# Patient Record
Sex: Male | Born: 2005
Health system: Southern US, Community
[De-identification: ages and names within clinical notes are randomized; demographics above are authoritative.]

## PROBLEM LIST (undated history)

## (undated) DIAGNOSIS — K5900 Constipation, unspecified: Secondary | ICD-10-CM

## (undated) HISTORY — PX: OTHER SURGICAL HISTORY: SHX169

---
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
## 2023-01-05 DIAGNOSIS — H47331 Pseudopapilledema of optic disc, right eye: Secondary | ICD-10-CM | POA: Diagnosis not present

## 2023-01-07 ENCOUNTER — Other Ambulatory Visit: Payer: Self-pay

## 2023-01-07 ENCOUNTER — Encounter: Payer: Self-pay | Admitting: Optometry

## 2023-01-07 ENCOUNTER — Other Ambulatory Visit: Payer: Self-pay | Admitting: Optometry

## 2023-01-07 DIAGNOSIS — H47331 Pseudopapilledema of optic disc, right eye: Secondary | ICD-10-CM

## 2023-01-07 DIAGNOSIS — H4720 Unspecified optic atrophy: Secondary | ICD-10-CM

## 2023-02-01 ENCOUNTER — Ambulatory Visit
Admission: RE | Admit: 2023-02-01 | Discharge: 2023-02-01 | Disposition: A | Discharge status: Home / Self Care | Payer: BC Managed Care – PPO | Source: Ambulatory Visit | Attending: Optometry | Admitting: Optometry

## 2023-02-01 DIAGNOSIS — H47331 Pseudopapilledema of optic disc, right eye: Secondary | ICD-10-CM | POA: Diagnosis not present

## 2023-02-01 MED ORDER — GADOPICLENOL 0.5 MMOL/ML IV SOLN
9.0000 mL | Freq: Once | INTRAVENOUS | Status: AC | PRN
  Administered 2023-02-01: 9 mL via INTRAVENOUS

## 2023-02-10 ENCOUNTER — Other Ambulatory Visit: Payer: BLUE CROSS/BLUE SHIELD

## 2023-07-06 DIAGNOSIS — Z131 Encounter for screening for diabetes mellitus: Secondary | ICD-10-CM | POA: Diagnosis not present

## 2023-07-06 DIAGNOSIS — Z0000 Encounter for general adult medical examination without abnormal findings: Secondary | ICD-10-CM | POA: Diagnosis not present

## 2023-07-06 DIAGNOSIS — E7800 Pure hypercholesterolemia, unspecified: Secondary | ICD-10-CM | POA: Diagnosis not present

## 2024-05-16 DIAGNOSIS — H5213 Myopia, bilateral: Secondary | ICD-10-CM | POA: Diagnosis not present

## 2024-07-06 DIAGNOSIS — Z0000 Encounter for general adult medical examination without abnormal findings: Secondary | ICD-10-CM | POA: Diagnosis not present

## 2024-07-06 DIAGNOSIS — Z131 Encounter for screening for diabetes mellitus: Secondary | ICD-10-CM | POA: Diagnosis not present

## 2024-07-06 DIAGNOSIS — E7800 Pure hypercholesterolemia, unspecified: Secondary | ICD-10-CM | POA: Diagnosis not present
